# Patient Record
Sex: Male | Born: 1935 | Race: White | Hispanic: No | Marital: Married
Health system: Midwestern US, Academic
[De-identification: ages and names within clinical notes are randomized; demographics above are authoritative.]

---

## 2016-10-02 MED ORDER — HYDROCHLOROTHIAZIDE 25 MG PO TAB
12.5 mg | ORAL_TABLET | Freq: Every day | ORAL | 3 refills | 28.00000 days | Status: DC
Start: 2016-10-02 — End: 2017-09-03

## 2016-10-02 MED ORDER — LISINOPRIL 10 MG PO TAB
10 mg | ORAL_TABLET | Freq: Every day | ORAL | 3 refills | Status: DC
Start: 2016-10-02 — End: 2017-09-03

## 2016-10-02 MED ORDER — METOPROLOL TARTRATE 25 MG PO TAB
12.5 mg | ORAL_TABLET | Freq: Two times a day (BID) | ORAL | 3 refills | 90.00000 days | Status: DC
Start: 2016-10-02 — End: 2017-09-03

## 2016-10-02 MED ORDER — ATORVASTATIN 40 MG PO TAB
20 mg | ORAL_TABLET | Freq: Every day | ORAL | 3 refills | Status: DC
Start: 2016-10-02 — End: 2017-09-03

## 2016-12-11 ENCOUNTER — Encounter: Admit: 2016-12-11 | Discharge: 2016-12-11 | Payer: MEDICARE

## 2016-12-11 DIAGNOSIS — I2510 Atherosclerotic heart disease of native coronary artery without angina pectoris: Principal | ICD-10-CM

## 2017-01-21 ENCOUNTER — Encounter: Admit: 2017-01-21 | Discharge: 2017-01-21 | Payer: MEDICARE

## 2017-02-03 ENCOUNTER — Encounter: Admit: 2017-02-03 | Discharge: 2017-02-03 | Payer: MEDICARE

## 2017-02-03 ENCOUNTER — Ambulatory Visit: Admit: 2017-02-03 | Discharge: 2017-02-04 | Payer: MEDICARE

## 2017-02-03 DIAGNOSIS — E119 Type 2 diabetes mellitus without complications: ICD-10-CM

## 2017-02-03 DIAGNOSIS — I350 Nonrheumatic aortic (valve) stenosis: ICD-10-CM

## 2017-02-03 DIAGNOSIS — E785 Hyperlipidemia, unspecified: ICD-10-CM

## 2017-02-03 DIAGNOSIS — I2510 Atherosclerotic heart disease of native coronary artery without angina pectoris: Principal | ICD-10-CM

## 2017-02-03 DIAGNOSIS — I6529 Occlusion and stenosis of unspecified carotid artery: ICD-10-CM

## 2017-02-03 DIAGNOSIS — K929 Disease of digestive system, unspecified: ICD-10-CM

## 2017-02-03 DIAGNOSIS — Z87442 Personal history of urinary calculi: ICD-10-CM

## 2017-02-03 DIAGNOSIS — Z953 Presence of xenogenic heart valve: ICD-10-CM

## 2017-02-03 DIAGNOSIS — C4490 Unspecified malignant neoplasm of skin, unspecified: ICD-10-CM

## 2017-02-03 DIAGNOSIS — Z951 Presence of aortocoronary bypass graft: ICD-10-CM

## 2017-02-03 DIAGNOSIS — I10 Essential (primary) hypertension: ICD-10-CM

## 2017-02-03 DIAGNOSIS — H409 Unspecified glaucoma: ICD-10-CM

## 2017-02-03 DIAGNOSIS — T8203XS Leakage of heart valve prosthesis, sequela: ICD-10-CM

## 2017-02-03 DIAGNOSIS — C679 Malignant neoplasm of bladder, unspecified: ICD-10-CM

## 2017-02-03 DIAGNOSIS — E7800 Pure hypercholesterolemia, unspecified: ICD-10-CM

## 2017-02-04 DIAGNOSIS — I480 Paroxysmal atrial fibrillation: ICD-10-CM

## 2017-02-04 DIAGNOSIS — Z951 Presence of aortocoronary bypass graft: ICD-10-CM

## 2017-02-04 DIAGNOSIS — I444 Left anterior fascicular block: ICD-10-CM

## 2017-02-04 DIAGNOSIS — E784 Other hyperlipidemia: ICD-10-CM

## 2017-02-04 DIAGNOSIS — E119 Type 2 diabetes mellitus without complications: Secondary | ICD-10-CM

## 2017-02-04 DIAGNOSIS — I10 Essential (primary) hypertension: ICD-10-CM

## 2017-02-04 DIAGNOSIS — I350 Nonrheumatic aortic (valve) stenosis: ICD-10-CM

## 2017-02-04 DIAGNOSIS — Z952 Presence of prosthetic heart valve: Secondary | ICD-10-CM

## 2017-02-04 DIAGNOSIS — I2510 Atherosclerotic heart disease of native coronary artery without angina pectoris: Principal | ICD-10-CM

## 2017-02-04 DIAGNOSIS — Z8551 Personal history of malignant neoplasm of bladder: Secondary | ICD-10-CM

## 2017-08-21 ENCOUNTER — Encounter: Admit: 2017-08-21 | Discharge: 2017-08-21 | Payer: MEDICARE

## 2017-08-21 DIAGNOSIS — I10 Essential (primary) hypertension: ICD-10-CM

## 2017-08-21 DIAGNOSIS — E7800 Pure hypercholesterolemia, unspecified: ICD-10-CM

## 2017-08-21 DIAGNOSIS — C679 Malignant neoplasm of bladder, unspecified: ICD-10-CM

## 2017-08-21 DIAGNOSIS — I6529 Occlusion and stenosis of unspecified carotid artery: ICD-10-CM

## 2017-08-21 DIAGNOSIS — Z87442 Personal history of urinary calculi: ICD-10-CM

## 2017-08-21 DIAGNOSIS — C4490 Unspecified malignant neoplasm of skin, unspecified: ICD-10-CM

## 2017-08-21 DIAGNOSIS — Z951 Presence of aortocoronary bypass graft: ICD-10-CM

## 2017-08-21 DIAGNOSIS — H409 Unspecified glaucoma: ICD-10-CM

## 2017-08-21 DIAGNOSIS — I2510 Atherosclerotic heart disease of native coronary artery without angina pectoris: Principal | ICD-10-CM

## 2017-08-21 DIAGNOSIS — Z953 Presence of xenogenic heart valve: ICD-10-CM

## 2017-08-21 DIAGNOSIS — E785 Hyperlipidemia, unspecified: ICD-10-CM

## 2017-08-21 DIAGNOSIS — E119 Type 2 diabetes mellitus without complications: Secondary | ICD-10-CM

## 2017-08-21 DIAGNOSIS — K929 Disease of digestive system, unspecified: ICD-10-CM

## 2017-08-21 DIAGNOSIS — I350 Nonrheumatic aortic (valve) stenosis: ICD-10-CM

## 2017-08-28 ENCOUNTER — Encounter: Admit: 2017-08-28 | Discharge: 2017-08-28 | Payer: MEDICARE

## 2017-09-03 ENCOUNTER — Encounter: Admit: 2017-09-03 | Discharge: 2017-09-03 | Payer: MEDICARE

## 2017-09-03 ENCOUNTER — Ambulatory Visit: Admit: 2017-09-03 | Discharge: 2017-09-03 | Payer: MEDICARE

## 2017-09-03 DIAGNOSIS — E7800 Pure hypercholesterolemia, unspecified: ICD-10-CM

## 2017-09-03 DIAGNOSIS — I2510 Atherosclerotic heart disease of native coronary artery without angina pectoris: Secondary | ICD-10-CM

## 2017-09-03 DIAGNOSIS — Z01810 Encounter for preprocedural cardiovascular examination: ICD-10-CM

## 2017-09-03 DIAGNOSIS — E785 Hyperlipidemia, unspecified: ICD-10-CM

## 2017-09-03 DIAGNOSIS — C4490 Unspecified malignant neoplasm of skin, unspecified: ICD-10-CM

## 2017-09-03 DIAGNOSIS — K929 Disease of digestive system, unspecified: ICD-10-CM

## 2017-09-03 DIAGNOSIS — Z953 Presence of xenogenic heart valve: ICD-10-CM

## 2017-09-03 DIAGNOSIS — I350 Nonrheumatic aortic (valve) stenosis: ICD-10-CM

## 2017-09-03 DIAGNOSIS — Z951 Presence of aortocoronary bypass graft: ICD-10-CM

## 2017-09-03 DIAGNOSIS — H409 Unspecified glaucoma: ICD-10-CM

## 2017-09-03 DIAGNOSIS — I6529 Occlusion and stenosis of unspecified carotid artery: ICD-10-CM

## 2017-09-03 DIAGNOSIS — I25810 Atherosclerosis of coronary artery bypass graft(s) without angina pectoris: ICD-10-CM

## 2017-09-03 DIAGNOSIS — Z87442 Personal history of urinary calculi: ICD-10-CM

## 2017-09-03 DIAGNOSIS — Z952 Presence of prosthetic heart valve: ICD-10-CM

## 2017-09-03 DIAGNOSIS — I10 Essential (primary) hypertension: ICD-10-CM

## 2017-09-03 DIAGNOSIS — C679 Malignant neoplasm of bladder, unspecified: ICD-10-CM

## 2017-09-03 DIAGNOSIS — Z8551 Personal history of malignant neoplasm of bladder: ICD-10-CM

## 2017-09-03 DIAGNOSIS — E119 Type 2 diabetes mellitus without complications: Secondary | ICD-10-CM

## 2017-09-03 MED ORDER — METOPROLOL TARTRATE 25 MG PO TAB
12.5 mg | ORAL_TABLET | Freq: Two times a day (BID) | ORAL | 3 refills | 90.00000 days | Status: AC
Start: 2017-09-03 — End: 2017-11-03

## 2017-09-03 MED ORDER — ATORVASTATIN 40 MG PO TAB
20 mg | ORAL_TABLET | Freq: Every day | ORAL | 3 refills | Status: AC
Start: 2017-09-03 — End: 2017-11-03

## 2017-09-03 MED ORDER — LISINOPRIL 10 MG PO TAB
10 mg | ORAL_TABLET | Freq: Every day | ORAL | 3 refills | Status: AC
Start: 2017-09-03 — End: 2017-11-03

## 2017-09-03 MED ORDER — HYDROCHLOROTHIAZIDE 25 MG PO TAB
12.5 mg | ORAL_TABLET | Freq: Every day | ORAL | 3 refills | 28.00000 days | Status: AC
Start: 2017-09-03 — End: 2017-11-03

## 2017-11-03 ENCOUNTER — Encounter: Admit: 2017-11-03 | Discharge: 2017-11-03 | Payer: MEDICARE

## 2017-11-03 DIAGNOSIS — I350 Nonrheumatic aortic (valve) stenosis: Principal | ICD-10-CM

## 2017-11-03 MED ORDER — METOPROLOL TARTRATE 25 MG PO TAB
12.5 mg | ORAL_TABLET | Freq: Two times a day (BID) | ORAL | 3 refills | 90.00000 days | Status: AC
Start: 2017-11-03 — End: 2017-12-31

## 2017-11-03 MED ORDER — HYDROCHLOROTHIAZIDE 25 MG PO TAB
12.5 mg | ORAL_TABLET | Freq: Every day | ORAL | 3 refills | Status: SS
Start: 2017-11-03 — End: 2017-12-31

## 2017-11-03 MED ORDER — ATORVASTATIN 40 MG PO TAB
20 mg | ORAL_TABLET | Freq: Every day | ORAL | 3 refills | Status: AC
Start: 2017-11-03 — End: 2018-10-07

## 2017-11-03 MED ORDER — LISINOPRIL 10 MG PO TAB
10 mg | ORAL_TABLET | Freq: Every day | ORAL | 3 refills | Status: SS
Start: 2017-11-03 — End: 2017-12-31

## 2017-12-28 ENCOUNTER — Encounter: Admit: 2017-12-28 | Discharge: 2017-12-29 | Payer: MEDICARE

## 2017-12-29 ENCOUNTER — Encounter: Admit: 2017-12-29 | Discharge: 2017-12-29 | Payer: MEDICARE

## 2017-12-29 DIAGNOSIS — I480 Paroxysmal atrial fibrillation: Principal | ICD-10-CM

## 2017-12-29 MED ORDER — POTASSIUM CHLORIDE IN WATER 10 MEQ/50 ML IV PGBK
10 meq | INTRAVENOUS | 0 refills | Status: CP
Start: 2017-12-29 — End: ?
  Administered 2017-12-30 (×4): 10 meq via INTRAVENOUS

## 2017-12-29 MED ORDER — POTASSIUM CHLORIDE 20 MEQ PO TBTQ
40 meq | Freq: Once | ORAL | 0 refills | Status: CP
Start: 2017-12-29 — End: ?
  Administered 2017-12-30: 05:00:00 40 meq via ORAL

## 2017-12-29 MED ORDER — LISINOPRIL 10 MG PO TAB
10 mg | Freq: Every day | ORAL | 0 refills | Status: DC
Start: 2017-12-29 — End: 2017-12-29

## 2017-12-29 MED ORDER — TAMSULOSIN 0.4 MG PO CAP
.4 mg | Freq: Every evening | ORAL | 0 refills | Status: DC
Start: 2017-12-29 — End: 2017-12-31
  Administered 2017-12-30 – 2017-12-31 (×2): 0.4 mg via ORAL

## 2017-12-29 MED ORDER — METOPROLOL TARTRATE 25 MG PO TAB
25 mg | Freq: Two times a day (BID) | ORAL | 0 refills | Status: DC
Start: 2017-12-29 — End: 2017-12-30
  Administered 2017-12-30 (×2): 25 mg via ORAL

## 2017-12-29 MED ORDER — VANCOMYCIN 1G/250ML D5W IVPB (VIAL2BAG)
1 g | INTRAVENOUS | 0 refills | Status: DC
Start: 2017-12-29 — End: 2017-12-31
  Administered 2017-12-30 – 2017-12-31 (×4): 1000 mg via INTRAVENOUS

## 2017-12-29 MED ORDER — PIPERACILLIN/TAZOBACTAM 3.375 G/NS IVPB (MB+)
3.375 g | INTRAVENOUS | 0 refills | Status: DC
Start: 2017-12-29 — End: 2017-12-30
  Administered 2017-12-30 (×2): 3.375 g via INTRAVENOUS

## 2017-12-29 MED ORDER — LATANOPROST 0.005 % OP DROP
1 [drp] | Freq: Every evening | OPHTHALMIC | 0 refills | Status: DC
Start: 2017-12-29 — End: 2017-12-31
  Administered 2017-12-30: 01:00:00 1 [drp] via OPHTHALMIC

## 2017-12-29 MED ORDER — ASPIRIN 81 MG PO CHEW
81 mg | Freq: Every day | ORAL | 0 refills | Status: DC
Start: 2017-12-29 — End: 2017-12-31
  Administered 2017-12-29 – 2017-12-31 (×3): 81 mg via ORAL

## 2017-12-29 MED ORDER — ENOXAPARIN 80 MG/0.8 ML SC SYRG
80 mg | Freq: Two times a day (BID) | SUBCUTANEOUS | 0 refills | Status: DC
Start: 2017-12-29 — End: 2017-12-31
  Administered 2017-12-30 – 2017-12-31 (×4): 80 mg via SUBCUTANEOUS

## 2017-12-29 MED ORDER — ATORVASTATIN 20 MG PO TAB
10 mg | Freq: Every day | ORAL | 0 refills | Status: DC
Start: 2017-12-29 — End: 2017-12-31
  Administered 2017-12-29 – 2017-12-31 (×3): 10 mg via ORAL

## 2017-12-29 MED ORDER — DILTIAZEM 125MG/125ML IV DRIP
5-15 mg/h | INTRAVENOUS | 0 refills | Status: DC
Start: 2017-12-29 — End: 2017-12-30
  Administered 2017-12-29 (×2): 5 mg/h via INTRAVENOUS

## 2017-12-29 MED ORDER — VANCOMYCIN PHARMACY TO MANAGE
1 | 0 refills | Status: DC
Start: 2017-12-29 — End: 2017-12-31

## 2017-12-29 MED ORDER — INSULIN ASPART 100 UNIT/ML SC FLEXPEN
0-6 [IU] | Freq: Before meals | SUBCUTANEOUS | 0 refills | Status: DC
Start: 2017-12-29 — End: 2017-12-31
  Administered 2017-12-30: 03:00:00 1 [IU] via SUBCUTANEOUS

## 2017-12-29 MED ORDER — MULTIVITAMIN PO TAB
1 | Freq: Every day | ORAL | 0 refills | Status: DC
Start: 2017-12-29 — End: 2017-12-31
  Administered 2017-12-30 – 2017-12-31 (×3): 1 via ORAL

## 2017-12-29 MED ORDER — CALCIUM CARBONATE-VITAMIN D3 500 MG(1,250MG) -200 UNIT PO TAB
1 | Freq: Every day | ORAL | 0 refills | Status: DC
Start: 2017-12-29 — End: 2017-12-31
  Administered 2017-12-29 – 2017-12-31 (×3): 1 via ORAL

## 2017-12-29 MED ORDER — FINASTERIDE 5 MG PO TAB
5 mg | Freq: Every evening | ORAL | 0 refills | Status: DC
Start: 2017-12-29 — End: 2017-12-31
  Administered 2017-12-30 – 2017-12-31 (×2): 5 mg via ORAL

## 2017-12-29 MED ORDER — PIPERACILLIN/TAZOBACTAM 3.375 G/NS IVPB (MB+)
3.375 g | INTRAVENOUS | 0 refills | Status: DC
Start: 2017-12-29 — End: 2017-12-30
  Administered 2017-12-30 (×4): 3.375 g via INTRAVENOUS

## 2017-12-30 LAB — CBC AND DIFF
Lab: 6.2 10*3/uL (ref 4.5–11.0)
Lab: 6.6 K/UL — ABNORMAL LOW (ref 4.5–11.0)

## 2017-12-30 LAB — URINALYSIS DIPSTICK
Lab: NEGATIVE 10*3/uL (ref 0–0.20)
Lab: NEGATIVE mL/min (ref 0–0.80)

## 2017-12-30 LAB — PHOSPHORUS: Lab: 2.3 mg/dL (ref 2.0–4.5)

## 2017-12-30 LAB — COMPREHENSIVE METABOLIC PANEL
Lab: 107 MMOL/L — ABNORMAL LOW (ref 98–110)
Lab: 137 MMOL/L — ABNORMAL LOW (ref 137–147)
Lab: 253 mg/dL — ABNORMAL HIGH (ref 70–100)
Lab: 141 MMOL/L — ABNORMAL LOW (ref 137–147)

## 2017-12-30 LAB — LIPID PROFILE
Lab: 128 mg/dL
Lab: 153 mg/dL (ref <200)
Lab: 31 mg/dL

## 2017-12-30 LAB — TROPONIN-I
Lab: 0.2 ng/mL — ABNORMAL HIGH (ref 0.0–0.05)
Lab: 0.2 ng/mL — ABNORMAL HIGH (ref 0.0–0.05)
Lab: 0.1 ng/mL — ABNORMAL HIGH (ref 0.0–0.05)

## 2017-12-30 LAB — BNP (B-TYPE NATRIURETIC PEPTI): Lab: 546 pg/mL — ABNORMAL HIGH (ref 0–100)

## 2017-12-30 LAB — MAGNESIUM: Lab: 1.6 mg/dL (ref 1.6–2.6)

## 2017-12-30 LAB — POC GLUCOSE
Lab: 235 mg/dL — ABNORMAL HIGH (ref 70–100)
Lab: 155 mg/dL — ABNORMAL HIGH (ref 70–100)
Lab: 165 mg/dL — ABNORMAL HIGH (ref 70–100)
Lab: 233 mg/dL — ABNORMAL HIGH (ref 70–100)

## 2017-12-30 LAB — URINALYSIS, MICROSCOPIC

## 2017-12-30 LAB — HEMOGLOBIN A1C: Lab: 9.5 % — ABNORMAL HIGH (ref <150)

## 2017-12-30 LAB — TSH WITH FREE T4 REFLEX: Lab: 3.8 uU/mL (ref 0.35–5.00)

## 2017-12-30 LAB — PTT (APTT): Lab: 23 s — ABNORMAL LOW (ref <100)

## 2017-12-30 LAB — VANCOMYCIN TROUGH: Lab: 7.9 ug/mL — ABNORMAL LOW (ref 10.0–20.0)

## 2017-12-30 LAB — PROTIME INR (PT): Lab: 1.1 mg/dL — ABNORMAL LOW (ref >40)

## 2017-12-30 MED ORDER — METOPROLOL TARTRATE 25 MG PO TAB
25 mg | Freq: Once | ORAL | 0 refills | Status: CP
Start: 2017-12-30 — End: ?
  Administered 2017-12-30: 17:00:00 25 mg via ORAL

## 2017-12-30 MED ORDER — METOPROLOL TARTRATE 25 MG PO TAB
37.5 mg | Freq: Two times a day (BID) | ORAL | 0 refills | Status: DC
Start: 2017-12-30 — End: 2017-12-31
  Administered 2017-12-31 (×2): 37.5 mg via ORAL

## 2017-12-30 MED ORDER — PIPERACILLIN/TAZOBACTAM 3.375 G/NS IVPB (MB+)
3.375 g | INTRAVENOUS | 0 refills | Status: DC
Start: 2017-12-30 — End: 2017-12-31
  Administered 2017-12-30 – 2017-12-31 (×8): 3.375 g via INTRAVENOUS

## 2017-12-30 MED ORDER — PERFLUTREN LIPID MICROSPHERES 1.1 MG/ML IV SUSP
1-20 mL | Freq: Once | INTRAVENOUS | 0 refills | Status: CP | PRN
Start: 2017-12-30 — End: ?
  Administered 2017-12-30: 17:00:00 2.5 mL via INTRAVENOUS

## 2017-12-30 MED ADMIN — SODIUM CHLORIDE 0.9 % IV SOLP [27838]: 1000 mL | INTRAVENOUS | @ 05:00:00 | Stop: 2017-12-30 | NDC 00338004904

## 2017-12-31 ENCOUNTER — Inpatient Hospital Stay
Admit: 2017-12-29 | Discharge: 2017-12-31 | Disposition: A | Discharge status: Home / Self Care | Payer: MEDICARE | Source: Other Acute Inpatient Hospital

## 2017-12-31 ENCOUNTER — Inpatient Hospital Stay: Admit: 2017-12-30 | Discharge: 2017-12-30 | Payer: MEDICARE

## 2017-12-31 ENCOUNTER — Inpatient Hospital Stay: Admit: 2017-12-29 | Discharge: 2017-12-29 | Payer: MEDICARE

## 2017-12-31 DIAGNOSIS — R55 Syncope and collapse: ICD-10-CM

## 2017-12-31 DIAGNOSIS — I452 Bifascicular block: ICD-10-CM

## 2017-12-31 DIAGNOSIS — Z8551 Personal history of malignant neoplasm of bladder: ICD-10-CM

## 2017-12-31 DIAGNOSIS — N390 Urinary tract infection, site not specified: ICD-10-CM

## 2017-12-31 DIAGNOSIS — Z953 Presence of xenogenic heart valve: ICD-10-CM

## 2017-12-31 DIAGNOSIS — I824Z2 Acute embolism and thrombosis of unspecified deep veins of left distal lower extremity: ICD-10-CM

## 2017-12-31 DIAGNOSIS — I129 Hypertensive chronic kidney disease with stage 1 through stage 4 chronic kidney disease, or unspecified chronic kidney disease: ICD-10-CM

## 2017-12-31 DIAGNOSIS — E876 Hypokalemia: ICD-10-CM

## 2017-12-31 DIAGNOSIS — Z951 Presence of aortocoronary bypass graft: ICD-10-CM

## 2017-12-31 DIAGNOSIS — E1122 Type 2 diabetes mellitus with diabetic chronic kidney disease: ICD-10-CM

## 2017-12-31 DIAGNOSIS — E785 Hyperlipidemia, unspecified: ICD-10-CM

## 2017-12-31 DIAGNOSIS — Z85828 Personal history of other malignant neoplasm of skin: ICD-10-CM

## 2017-12-31 DIAGNOSIS — I480 Paroxysmal atrial fibrillation: Principal | ICD-10-CM

## 2017-12-31 DIAGNOSIS — I2510 Atherosclerotic heart disease of native coronary artery without angina pectoris: ICD-10-CM

## 2017-12-31 DIAGNOSIS — K219 Gastro-esophageal reflux disease without esophagitis: ICD-10-CM

## 2017-12-31 DIAGNOSIS — N183 Chronic kidney disease, stage 3 (moderate): ICD-10-CM

## 2017-12-31 DIAGNOSIS — Z794 Long term (current) use of insulin: ICD-10-CM

## 2017-12-31 DIAGNOSIS — Z66 Do not resuscitate: ICD-10-CM

## 2017-12-31 DIAGNOSIS — N400 Enlarged prostate without lower urinary tract symptoms: ICD-10-CM

## 2017-12-31 LAB — POC GLUCOSE
Lab: 195 mg/dL — ABNORMAL HIGH (ref 70–100)
Lab: 161 mg/dL — ABNORMAL HIGH (ref 70–100)
Lab: 184 mg/dL — ABNORMAL HIGH (ref 70–100)

## 2017-12-31 LAB — CULTURE-URINE W/SENSITIVITY: Lab: <10 U/L — AB (ref 7–40)

## 2017-12-31 LAB — COMPREHENSIVE METABOLIC PANEL: Lab: 142 MMOL/L — ABNORMAL LOW (ref >60)

## 2017-12-31 LAB — CBC AND DIFF: Lab: 6.5 K/UL — ABNORMAL HIGH (ref 4.5–11.0)

## 2017-12-31 MED ORDER — APIXABAN 5 MG PO TAB
10 mg | Freq: Two times a day (BID) | ORAL | 0 refills | Status: DC
Start: 2017-12-31 — End: 2017-12-31
  Administered 2017-12-31: 21:00:00 10 mg via ORAL

## 2017-12-31 MED ORDER — METOPROLOL TARTRATE 37.5 MG PO TAB
37.5 mg | ORAL_TABLET | Freq: Two times a day (BID) | ORAL | 1 refills | 90.00000 days | Status: AC
Start: 2017-12-31 — End: 2018-09-17

## 2017-12-31 MED ORDER — APIXABAN 5 MG PO TAB
5 mg | ORAL_TABLET | Freq: Two times a day (BID) | ORAL | 1 refills | Status: AC
Start: 2017-12-31 — End: 2018-02-05

## 2017-12-31 MED ORDER — HYDROCHLOROTHIAZIDE 25 MG PO TAB
12.5 mg | ORAL_TABLET | Freq: Every day | ORAL | 3 refills | 28.00000 days | Status: AC
Start: 2017-12-31 — End: 2018-09-17

## 2017-12-31 MED ORDER — ASCORBIC ACID (VITAMIN C) 500 MG PO TAB
250 mg | Freq: Every day | ORAL | 0 refills | Status: DC
Start: 2017-12-31 — End: 2017-12-31
  Administered 2017-12-31: 20:00:00 250 mg via ORAL

## 2017-12-31 MED ORDER — METOPROLOL TARTRATE 25 MG PO TAB
37.5 mg | ORAL_TABLET | Freq: Two times a day (BID) | ORAL | 1 refills | Status: CN
Start: 2017-12-31 — End: ?

## 2017-12-31 MED ORDER — ZINC SULFATE 220 (50) MG PO CAP
220 mg | Freq: Every day | ORAL | 0 refills | Status: DC
Start: 2017-12-31 — End: 2017-12-31
  Administered 2017-12-31: 20:00:00 220 mg via ORAL

## 2017-12-31 MED ORDER — APIXABAN 5 MG (74 TABS) PO DSPK
ORAL_TABLET | Freq: Two times a day (BID) | 0 refills | Status: AC
Start: 2017-12-31 — End: 2018-02-05

## 2017-12-31 MED ORDER — LISINOPRIL 10 MG PO TAB
10 mg | ORAL_TABLET | Freq: Every day | ORAL | 3 refills | Status: AC
Start: 2017-12-31 — End: 2018-10-07

## 2018-01-15 ENCOUNTER — Encounter: Admit: 2018-01-15 | Discharge: 2018-01-15 | Payer: MEDICARE

## 2018-02-05 ENCOUNTER — Encounter: Admit: 2018-02-05 | Discharge: 2018-02-05 | Payer: MEDICARE

## 2018-02-05 ENCOUNTER — Ambulatory Visit: Admit: 2018-02-05 | Discharge: 2018-02-05 | Payer: MEDICARE

## 2018-02-05 DIAGNOSIS — I5032 Chronic diastolic (congestive) heart failure: ICD-10-CM

## 2018-02-05 DIAGNOSIS — E7800 Pure hypercholesterolemia, unspecified: ICD-10-CM

## 2018-02-05 DIAGNOSIS — H409 Unspecified glaucoma: ICD-10-CM

## 2018-02-05 DIAGNOSIS — I350 Nonrheumatic aortic (valve) stenosis: ICD-10-CM

## 2018-02-05 DIAGNOSIS — I480 Paroxysmal atrial fibrillation: ICD-10-CM

## 2018-02-05 DIAGNOSIS — I2510 Atherosclerotic heart disease of native coronary artery without angina pectoris: Secondary | ICD-10-CM

## 2018-02-05 DIAGNOSIS — I10 Essential (primary) hypertension: ICD-10-CM

## 2018-02-05 DIAGNOSIS — I6523 Occlusion and stenosis of bilateral carotid arteries: ICD-10-CM

## 2018-02-05 DIAGNOSIS — E119 Type 2 diabetes mellitus without complications: ICD-10-CM

## 2018-02-05 DIAGNOSIS — I471 Supraventricular tachycardia: ICD-10-CM

## 2018-02-05 DIAGNOSIS — Z953 Presence of xenogenic heart valve: ICD-10-CM

## 2018-02-05 DIAGNOSIS — Z951 Presence of aortocoronary bypass graft: ICD-10-CM

## 2018-02-05 DIAGNOSIS — I6529 Occlusion and stenosis of unspecified carotid artery: ICD-10-CM

## 2018-02-05 DIAGNOSIS — T8203XA Leakage of heart valve prosthesis, initial encounter: ICD-10-CM

## 2018-02-05 DIAGNOSIS — E782 Mixed hyperlipidemia: ICD-10-CM

## 2018-02-05 DIAGNOSIS — K929 Disease of digestive system, unspecified: ICD-10-CM

## 2018-02-05 DIAGNOSIS — I4891 Unspecified atrial fibrillation: ICD-10-CM

## 2018-02-05 DIAGNOSIS — I82592 Chronic embolism and thrombosis of other specified deep vein of left lower extremity: Principal | ICD-10-CM

## 2018-02-05 DIAGNOSIS — Z952 Presence of prosthetic heart valve: ICD-10-CM

## 2018-02-05 DIAGNOSIS — Z01810 Encounter for preprocedural cardiovascular examination: Secondary | ICD-10-CM

## 2018-02-05 DIAGNOSIS — Z8551 Personal history of malignant neoplasm of bladder: ICD-10-CM

## 2018-02-05 DIAGNOSIS — N183 Chronic kidney disease, stage 3 (moderate): ICD-10-CM

## 2018-02-05 DIAGNOSIS — N390 Urinary tract infection, site not specified: ICD-10-CM

## 2018-02-05 DIAGNOSIS — E785 Hyperlipidemia, unspecified: ICD-10-CM

## 2018-02-05 DIAGNOSIS — C679 Malignant neoplasm of bladder, unspecified: ICD-10-CM

## 2018-02-05 DIAGNOSIS — C4490 Unspecified malignant neoplasm of skin, unspecified: ICD-10-CM

## 2018-02-05 DIAGNOSIS — Z87442 Personal history of urinary calculi: ICD-10-CM

## 2018-09-17 ENCOUNTER — Encounter: Admit: 2018-09-17 | Discharge: 2018-09-17 | Payer: MEDICARE

## 2018-09-17 DIAGNOSIS — I350 Nonrheumatic aortic (valve) stenosis: Principal | ICD-10-CM

## 2018-09-17 MED ORDER — METOPROLOL TARTRATE 25 MG PO TAB
ORAL_TABLET | Freq: Two times a day (BID) | 0 refills
Start: 2018-09-17 — End: ?

## 2018-09-17 MED ORDER — METOPROLOL TARTRATE 37.5 MG PO TAB
37.5 mg | ORAL_TABLET | Freq: Every day | ORAL | 1 refills | 90.00000 days | Status: DC
Start: 2018-09-17 — End: 2018-10-07

## 2018-09-17 MED ORDER — HYDROCHLOROTHIAZIDE 25 MG PO TAB
ORAL_TABLET | Freq: Every day | ORAL | 1 refills | 28.00000 days | Status: DC
Start: 2018-09-17 — End: 2018-10-07

## 2018-09-17 NOTE — Telephone Encounter
I spoke with John Combs, who tells me that John Combs continues to take 12.5mg  of hctz daily.  He takes metoprolol 37.5mg  just once daily.  Refilled both per John Combs's request.

## 2018-10-07 ENCOUNTER — Encounter: Admit: 2018-10-07 | Discharge: 2018-10-07 | Payer: MEDICARE

## 2018-10-07 MED ORDER — ATORVASTATIN 20 MG PO TAB
20 mg | ORAL_TABLET | Freq: Every day | ORAL | 3 refills | Status: DC
Start: 2018-10-07 — End: 2019-05-04

## 2018-10-07 MED ORDER — HYDROCHLOROTHIAZIDE 12.5 MG PO TAB
12.5 mg | ORAL_TABLET | Freq: Every morning | ORAL | 3 refills | 30.00000 days | Status: DC
Start: 2018-10-07 — End: 2019-05-04

## 2018-10-07 MED ORDER — METOPROLOL TARTRATE 37.5 MG PO TAB
37.5 mg | ORAL_TABLET | Freq: Every day | ORAL | 3 refills | 90.00000 days | Status: DC
Start: 2018-10-07 — End: 2019-05-04

## 2018-10-20 ENCOUNTER — Encounter: Admit: 2018-10-20 | Discharge: 2018-10-20

## 2018-10-20 NOTE — Telephone Encounter
Rec'd call from [facility] with [facility] (ph: [phone] fax: [phone]).  On 6/30 Mr. John Combs is having a bladder biopsy with fulguration and they are needing cardiac risk stratification.  Requesting a note to be faxed to their office.

## 2018-10-20 NOTE — Telephone Encounter
I spoke with John Combs at [facility] and let her know that we would provide cardiac risk stratification once patient has been seen in the office on 6/24.

## 2018-10-20 NOTE — Telephone Encounter
Jaclynn Major, MD  Demon Volante, [place], RN   Caller: Unspecified (Today, 9:38 AM)            Doesn't sound like any contraindications, but will comment after I see him.    Previous Messages      ----- Message -----   From: Nichola Sizer, RN   Sent: 10/20/2018  9:46 AM CDT   To: Jaclynn Major, MD     ----- Message from Nichola Sizer, RN sent at 10/20/2018 9:46 AM CDT -----   Dr. Frutoso Chase, Mr. Mcmillon is having a 9 month follow up with you this month on 6/24. Can you go ahead and provide risk stratification for his 6/30 surgery or would you prefer to wait until you see him? Also, please let me know your recommendations on when he should stop his baby aspirin for this procedure. Thanks!

## 2018-11-03 ENCOUNTER — Encounter: Admit: 2018-11-03 | Discharge: 2018-11-03

## 2018-11-11 ENCOUNTER — Ambulatory Visit: Admit: 2018-11-11 | Discharge: 2018-11-12

## 2018-11-11 ENCOUNTER — Encounter: Admit: 2018-11-11 | Discharge: 2018-11-11

## 2018-11-11 DIAGNOSIS — I10 Essential (primary) hypertension: Secondary | ICD-10-CM

## 2018-11-11 DIAGNOSIS — I2510 Atherosclerotic heart disease of native coronary artery without angina pectoris: Secondary | ICD-10-CM

## 2018-11-11 DIAGNOSIS — Z952 Presence of prosthetic heart valve: Secondary | ICD-10-CM

## 2018-11-11 DIAGNOSIS — Z953 Presence of xenogenic heart valve: Secondary | ICD-10-CM

## 2018-11-11 DIAGNOSIS — Z87442 Personal history of urinary calculi: Secondary | ICD-10-CM

## 2018-11-11 DIAGNOSIS — T8203XA Leakage of heart valve prosthesis, initial encounter: Secondary | ICD-10-CM

## 2018-11-11 DIAGNOSIS — I4891 Unspecified atrial fibrillation: Secondary | ICD-10-CM

## 2018-11-11 DIAGNOSIS — Z951 Presence of aortocoronary bypass graft: Secondary | ICD-10-CM

## 2018-11-11 DIAGNOSIS — I350 Nonrheumatic aortic (valve) stenosis: Secondary | ICD-10-CM

## 2018-11-11 DIAGNOSIS — I480 Paroxysmal atrial fibrillation: Secondary | ICD-10-CM

## 2018-11-11 DIAGNOSIS — I25810 Atherosclerosis of coronary artery bypass graft(s) without angina pectoris: Secondary | ICD-10-CM

## 2018-11-11 DIAGNOSIS — N183 Chronic kidney disease, stage 3 (moderate): Secondary | ICD-10-CM

## 2018-11-11 DIAGNOSIS — I6529 Occlusion and stenosis of unspecified carotid artery: Secondary | ICD-10-CM

## 2018-11-11 DIAGNOSIS — Z8551 Personal history of malignant neoplasm of bladder: Secondary | ICD-10-CM

## 2018-11-11 DIAGNOSIS — I6523 Occlusion and stenosis of bilateral carotid arteries: Secondary | ICD-10-CM

## 2018-11-11 DIAGNOSIS — Z01810 Encounter for preprocedural cardiovascular examination: Secondary | ICD-10-CM

## 2018-11-11 DIAGNOSIS — E7800 Pure hypercholesterolemia, unspecified: Secondary | ICD-10-CM

## 2018-11-11 DIAGNOSIS — E119 Type 2 diabetes mellitus without complications: Secondary | ICD-10-CM

## 2018-11-11 DIAGNOSIS — E785 Hyperlipidemia, unspecified: Secondary | ICD-10-CM

## 2018-11-11 DIAGNOSIS — H409 Unspecified glaucoma: Secondary | ICD-10-CM

## 2018-11-11 DIAGNOSIS — E782 Mixed hyperlipidemia: Secondary | ICD-10-CM

## 2018-11-11 DIAGNOSIS — K929 Disease of digestive system, unspecified: Secondary | ICD-10-CM

## 2018-11-11 DIAGNOSIS — C679 Malignant neoplasm of bladder, unspecified: Secondary | ICD-10-CM

## 2018-11-11 DIAGNOSIS — C4490 Unspecified malignant neoplasm of skin, unspecified: Secondary | ICD-10-CM

## 2018-11-11 NOTE — Telephone Encounter
Patient has surgery with [facility] next week.  Faxed office visit note/clearance to [phone]

## 2018-11-11 NOTE — Patient Instructions
Before you leave, please schedule an October follow up with Dr. Frutoso Chase, with an echocardiogram.    John Combs will send a cardiac clearance note to your urologist.    If you have any questions in the meantime, please call John Combs, John Combs or send a MyChart message.  Thank you!

## 2018-11-12 DIAGNOSIS — E7849 Other hyperlipidemia: Secondary | ICD-10-CM

## 2018-11-12 DIAGNOSIS — N189 Chronic kidney disease, unspecified: Secondary | ICD-10-CM

## 2018-11-12 DIAGNOSIS — I10 Essential (primary) hypertension: Secondary | ICD-10-CM

## 2018-11-12 DIAGNOSIS — I5032 Chronic diastolic (congestive) heart failure: Secondary | ICD-10-CM

## 2018-11-12 DIAGNOSIS — I82592 Chronic embolism and thrombosis of other specified deep vein of left lower extremity: Secondary | ICD-10-CM

## 2018-11-12 DIAGNOSIS — Z8679 Personal history of other diseases of the circulatory system: Secondary | ICD-10-CM

## 2018-11-12 DIAGNOSIS — E119 Type 2 diabetes mellitus without complications: Secondary | ICD-10-CM

## 2018-11-12 DIAGNOSIS — I471 Supraventricular tachycardia: Secondary | ICD-10-CM

## 2018-11-19 ENCOUNTER — Encounter: Admit: 2018-11-19 | Discharge: 2018-11-19

## 2018-11-19 NOTE — Telephone Encounter
Rec'd call from [place] at [facility] pre-surgery clinic.  Patient has an upcoming surgery with Dr. Rickey Barbara - they need cardiac clearance from Dr. Frutoso Chase to be faxed to [phone].  Their phone number if needed is [phone].  Will fax most recent office visit note, which includes cardiac clearance.

## 2019-02-11 ENCOUNTER — Encounter: Admit: 2019-02-11 | Discharge: 2019-02-11 | Payer: MEDICARE

## 2019-02-16 LAB — COMPREHENSIVE METABOLIC PANEL
Lab: 1
Lab: 1.1
Lab: 105
Lab: 144
Lab: 167 — ABNORMAL HIGH
Lab: 18
Lab: 25
Lab: 3.8
Lab: 31
Lab: 4.3
Lab: 62
Lab: 7
Lab: 7.7
Lab: 9.5

## 2019-02-16 LAB — LIPID PROFILE: Lab: 150

## 2019-02-17 ENCOUNTER — Encounter: Admit: 2019-02-17 | Discharge: 2019-02-17 | Payer: MEDICARE

## 2019-02-17 DIAGNOSIS — E782 Mixed hyperlipidemia: Secondary | ICD-10-CM

## 2019-02-17 DIAGNOSIS — I10 Essential (primary) hypertension: Secondary | ICD-10-CM

## 2019-02-17 DIAGNOSIS — E119 Type 2 diabetes mellitus without complications: Secondary | ICD-10-CM

## 2019-02-22 NOTE — Patient Instructions
Please call our schedulers in about 30 days ([phone]) to set up a 6 month appointment with Dr. Ferrell.    If you have any questions in the meantime, please call Lafreda Casebeer, RN [phone] or send a MyChart message.  Thank you!

## 2019-05-04 ENCOUNTER — Encounter: Admit: 2019-05-04 | Discharge: 2019-05-04 | Payer: MEDICARE

## 2019-05-04 MED ORDER — HYDROCHLOROTHIAZIDE 12.5 MG PO TAB
12.5 mg | ORAL_TABLET | Freq: Every morning | ORAL | 3 refills | 30.00000 days | Status: AC
Start: 2019-05-04 — End: ?

## 2019-05-04 MED ORDER — METOPROLOL TARTRATE 37.5 MG PO TAB
37.5 mg | ORAL_TABLET | Freq: Every day | ORAL | 3 refills | 90.00000 days | Status: AC
Start: 2019-05-04 — End: ?

## 2019-05-04 MED ORDER — ATORVASTATIN 20 MG PO TAB
20 mg | ORAL_TABLET | Freq: Every day | ORAL | 3 refills | Status: AC
Start: 2019-05-04 — End: ?

## 2019-05-04 NOTE — Telephone Encounter
Patient LVM asking if he could get his prescriptions forwarded to [facility] in [place].  Forwarded per patient request.  Patient notified.

## 2019-09-01 ENCOUNTER — Encounter: Admit: 2019-09-01 | Discharge: 2019-09-01 | Payer: MEDICARE

## 2019-11-11 ENCOUNTER — Encounter: Admit: 2019-11-11 | Discharge: 2019-11-11 | Payer: MEDICARE

## 2020-01-05 ENCOUNTER — Encounter: Admit: 2020-01-05 | Discharge: 2020-01-05 | Payer: MEDICARE

## 2020-01-05 NOTE — Progress Notes
Please send the following records for continuity of care.     Most recent lipid and liver profile, chemistry panel, and thyroid panel.     Please fax to:  Attention    Fax: [phone]    Thank you,     [facility]  Cardiovascular Medicine  [address] [place] [address]  Phone: [phone]  Fax: [phone]

## 2020-01-13 ENCOUNTER — Encounter: Admit: 2020-01-13 | Discharge: 2020-01-13 | Payer: MEDICARE

## 2020-01-17 ENCOUNTER — Ambulatory Visit: Admit: 2020-01-17 | Discharge: 2020-01-17 | Payer: MEDICARE

## 2020-01-17 ENCOUNTER — Encounter: Admit: 2020-01-17 | Discharge: 2020-01-17 | Payer: MEDICARE

## 2020-01-17 DIAGNOSIS — Z8551 Personal history of malignant neoplasm of bladder: Secondary | ICD-10-CM

## 2020-01-17 DIAGNOSIS — I10 Essential (primary) hypertension: Secondary | ICD-10-CM

## 2020-01-17 DIAGNOSIS — I350 Nonrheumatic aortic (valve) stenosis: Secondary | ICD-10-CM

## 2020-01-17 DIAGNOSIS — C679 Malignant neoplasm of bladder, unspecified: Secondary | ICD-10-CM

## 2020-01-17 DIAGNOSIS — E119 Type 2 diabetes mellitus without complications: Secondary | ICD-10-CM

## 2020-01-17 DIAGNOSIS — C4490 Unspecified malignant neoplasm of skin, unspecified: Secondary | ICD-10-CM

## 2020-01-17 DIAGNOSIS — E7800 Pure hypercholesterolemia, unspecified: Secondary | ICD-10-CM

## 2020-01-17 DIAGNOSIS — I480 Paroxysmal atrial fibrillation: Secondary | ICD-10-CM

## 2020-01-17 DIAGNOSIS — Z951 Presence of aortocoronary bypass graft: Secondary | ICD-10-CM

## 2020-01-17 DIAGNOSIS — N183 Stage 3 chronic kidney disease, unspecified whether stage 3a or 3b CKD (HCC): Secondary | ICD-10-CM

## 2020-01-17 DIAGNOSIS — K929 Disease of digestive system, unspecified: Secondary | ICD-10-CM

## 2020-01-17 DIAGNOSIS — H409 Unspecified glaucoma: Secondary | ICD-10-CM

## 2020-01-17 DIAGNOSIS — I2510 Atherosclerotic heart disease of native coronary artery without angina pectoris: Secondary | ICD-10-CM

## 2020-01-17 DIAGNOSIS — I6529 Occlusion and stenosis of unspecified carotid artery: Secondary | ICD-10-CM

## 2020-01-17 DIAGNOSIS — E782 Mixed hyperlipidemia: Secondary | ICD-10-CM

## 2020-01-17 DIAGNOSIS — E785 Hyperlipidemia, unspecified: Secondary | ICD-10-CM

## 2020-01-17 DIAGNOSIS — Z87442 Personal history of urinary calculi: Secondary | ICD-10-CM

## 2020-01-17 DIAGNOSIS — I471 Supraventricular tachycardia: Secondary | ICD-10-CM

## 2020-01-17 DIAGNOSIS — Z953 Presence of xenogenic heart valve: Secondary | ICD-10-CM

## 2020-01-17 DIAGNOSIS — Z952 Presence of prosthetic heart valve: Secondary | ICD-10-CM

## 2020-01-17 NOTE — Patient Instructions
John Combs,    We will schedule an echo before you leave today to be done on a day that works for you.  [name] will call you with results as soon as they are available.  This may take up to 10 business days from the time of your procedure.    Please reduce metoprolol to 1/2 tablet (18.75mg ) daily.    Please call us back around November to schedule an April follow up with Dr. Jacqulyn Ducking.  The number to call is [phone].    If you have any questions in the meantime, please call Varney Baas [phone] or send a MyChart message.  Thank you!

## 2020-02-23 ENCOUNTER — Encounter: Admit: 2020-02-23 | Discharge: 2020-02-23 | Payer: MEDICARE

## 2020-02-23 ENCOUNTER — Ambulatory Visit: Admit: 2020-02-23 | Discharge: 2020-02-23 | Payer: MEDICARE

## 2020-02-23 DIAGNOSIS — I350 Nonrheumatic aortic (valve) stenosis: Secondary | ICD-10-CM

## 2020-03-02 ENCOUNTER — Encounter: Admit: 2020-03-02 | Discharge: 2020-03-02 | Payer: MEDICARE

## 2020-03-02 NOTE — Telephone Encounter
I spoke with John Combs and let him know that his preliminary echo results look unchanged.  I told him that I would call him back if Dr. Jacqulyn Ducking has any concerns.  Patient confirmed understanding.

## 2020-03-03 ENCOUNTER — Encounter: Admit: 2020-03-03 | Discharge: 2020-03-03 | Payer: MEDICARE

## 2020-04-26 ENCOUNTER — Encounter: Admit: 2020-04-26 | Discharge: 2020-04-26 | Payer: MEDICARE

## 2020-04-26 MED ORDER — HYDROCHLOROTHIAZIDE 12.5 MG PO TAB
12.5 mg | ORAL_TABLET | Freq: Every morning | ORAL | 3 refills | 30.00000 days | Status: AC
Start: 2020-04-26 — End: ?

## 2020-04-26 MED ORDER — ATORVASTATIN 20 MG PO TAB
20 mg | ORAL_TABLET | Freq: Every day | ORAL | 3 refills | Status: AC
Start: 2020-04-26 — End: ?

## 2020-04-26 MED ORDER — METOPROLOL TARTRATE 37.5 MG PO TAB
ORAL_TABLET | Freq: Every day | ORAL | 3 refills | 90.00000 days | Status: AC
Start: 2020-04-26 — End: ?

## 2020-04-28 ENCOUNTER — Encounter: Admit: 2020-04-28 | Discharge: 2020-04-28 | Payer: MEDICARE

## 2020-04-28 MED ORDER — HYDROCHLOROTHIAZIDE 12.5 MG PO TAB
12.5 mg | ORAL_TABLET | Freq: Every morning | ORAL | 3 refills | 30.00000 days | Status: AC
Start: 2020-04-28 — End: ?

## 2020-10-17 ENCOUNTER — Encounter: Admit: 2020-10-17 | Discharge: 2020-10-17 | Payer: MEDICARE

## 2020-10-17 NOTE — Progress Notes
Please send the following records for continuity of care:    Most recent lipid and liver profile, chemistry panel, and thyroid panel.      Please fax to:       [place]    [facility]- [facility]iology    FAX: [phone]      PHONE: [phone]

## 2020-10-20 ENCOUNTER — Encounter: Admit: 2020-10-20 | Discharge: 2020-10-20 | Payer: MEDICARE

## 2020-10-30 ENCOUNTER — Encounter: Admit: 2020-10-30 | Discharge: 2020-10-30 | Payer: MEDICARE

## 2021-03-20 DEATH — deceased

## 2022-11-18 IMAGING — MR MRI NECK SOFT [PERSON_NAME]/[PERSON_NAME] CONTRAST
8 series · 44 of 48 positions shown · IV contrast (prohance)
Comparison: CT soft tissue neck 06/12/20, PET/CT 08/07/20

INDICATION: 88 years-old Male with parotid gland cancer.
TECHNIQUE: Multiplanar, multisequence MRI of the neck was performed without and with intravenous contrast. The patient received an intravenous dose of 15 mL ProHance.

[Series 2: t2_tse_sag · sagittal · 6.0mm · 0.75mm/px · 3 of 25 slices shown]
[im 1/25]
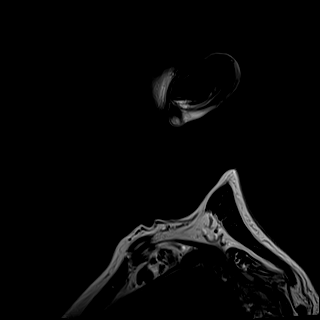
[im 13/25]
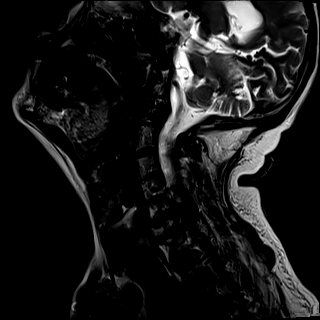
[im 25/25]
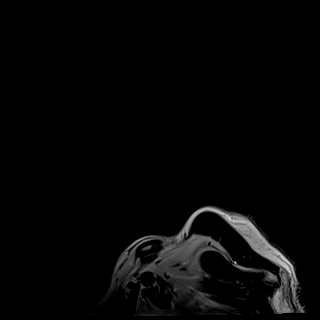

[Series 3: t1_tse_cor_p2 · coronal · 4.5mm · 0.75mm/px · 4 of 30 slices shown]
[im 1/30]
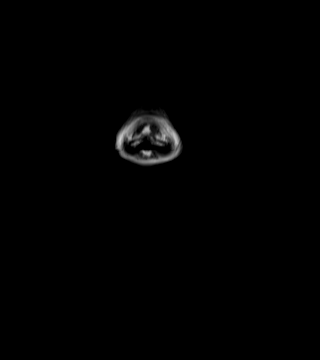
[im 10/30]
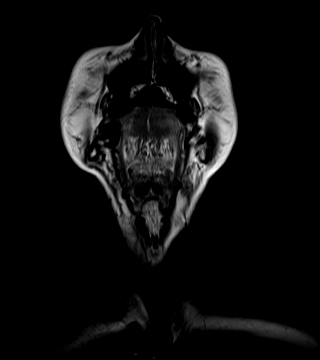
[im 20/30]
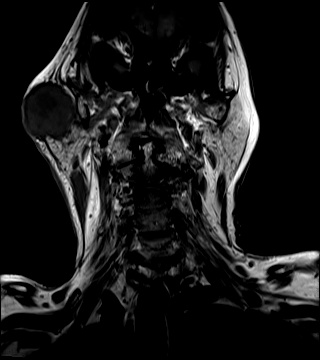
[im 30/30]
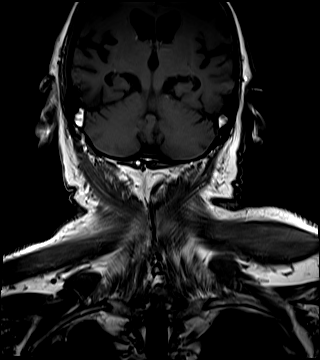

[Series 7: t2_(person_name)_(person_name)_(person_name)_(person_name) · coronal · 4.5mm · 0.75mm/px · 4 of 30 slices shown]
[im 1/30]
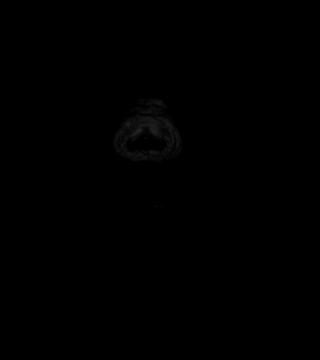
[im 10/30]
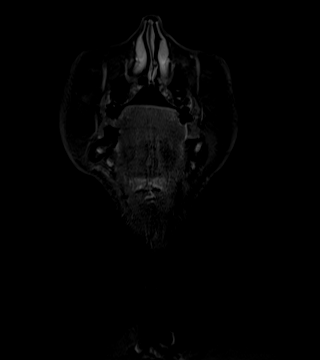
[im 20/30]
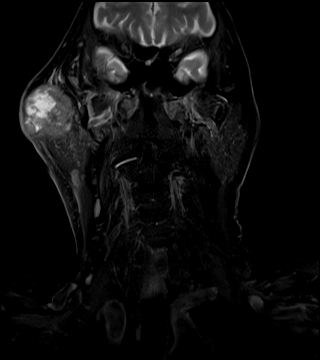
[im 30/30]
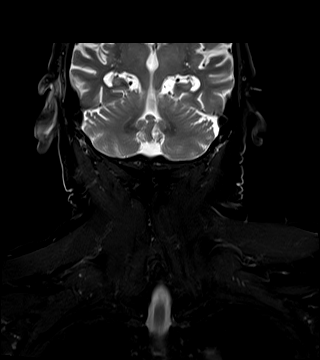

[Series 9: t1_(person_name)_(person_name)_(person_name)_in · axial · 4.0mm · 0.75mm/px · z∈[-208,+7]mm · 8 of 50 slices shown]
[im 1/50]
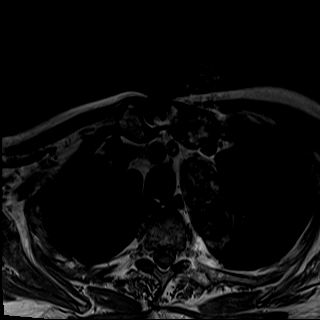
[im 8/50]
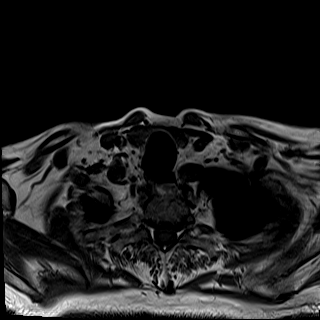
[im 15/50]
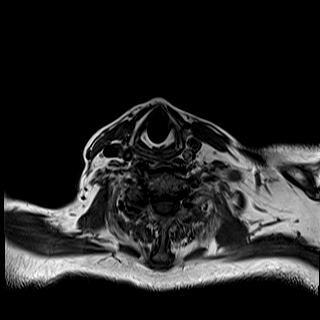
[im 22/50]
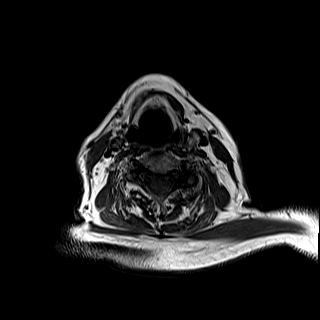
[im 29/50]
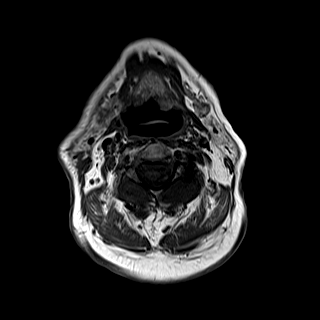
[im 36/50]
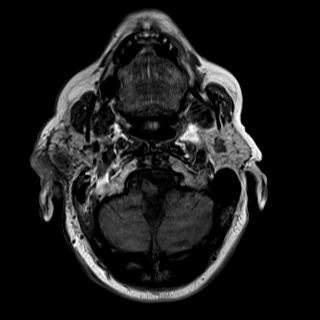
[im 43/50]
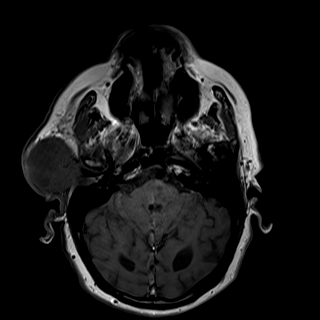
[im 50/50]
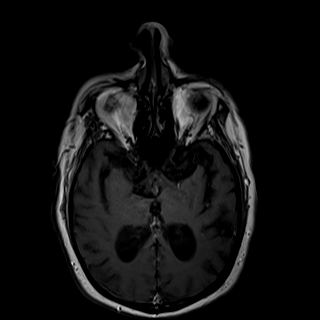

[Series 11: t1_(person_name)_(person_name)_(person_name)_(person_name) · axial · 4.0mm · 0.75mm/px · z∈[-208,+7]mm · 8 of 50 slices shown]
[im 1/50]
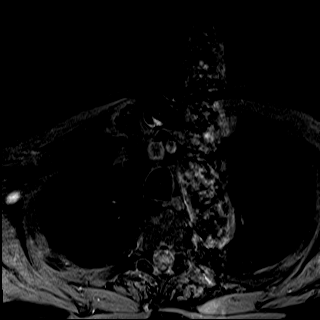
[im 8/50]
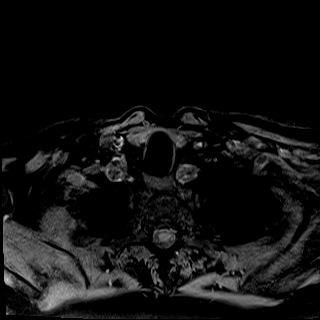
[im 15/50]
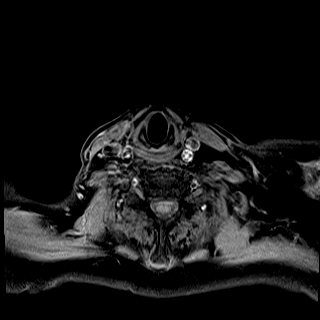
[im 22/50]
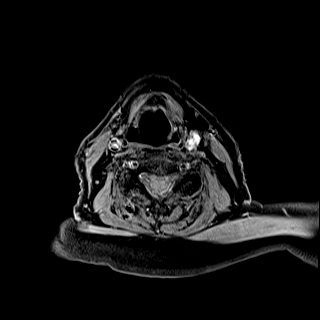
[im 29/50]
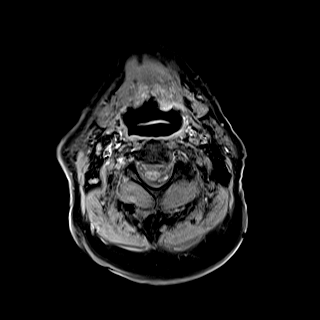
[im 36/50]
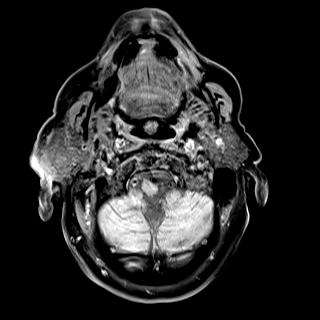
[im 43/50]
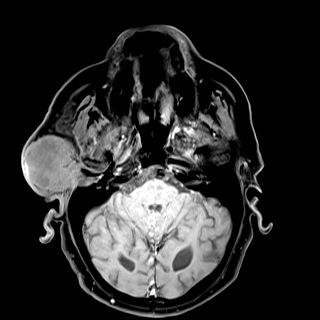
[im 50/50]
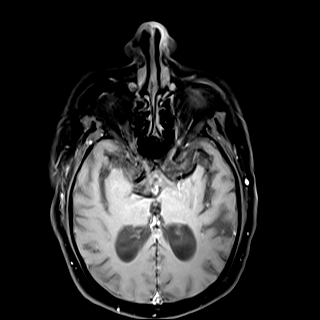

[Series 15: t2_(person_name)_(person_name)_(person_name)_(person_name)2_320_w · axial · 4.0mm · 0.75mm/px · z∈[-208,+7]mm · 8 of 50 slices shown]
[im 1/50]
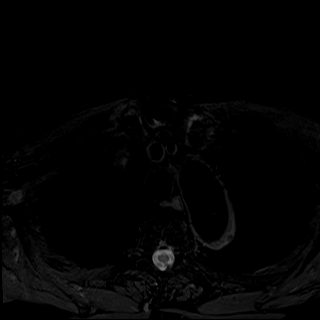
[im 8/50]
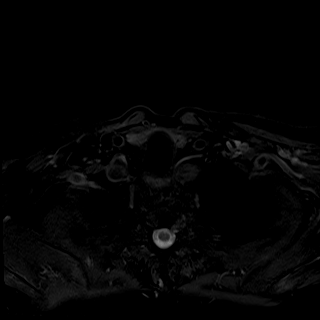
[im 15/50]
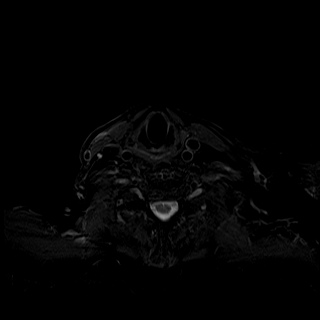
[im 22/50]
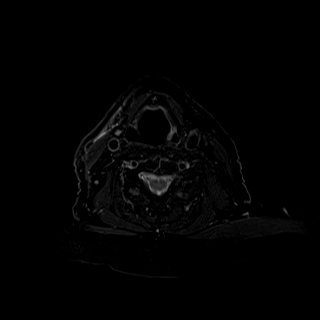
[im 29/50]
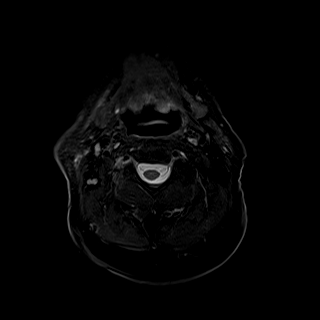
[im 36/50]
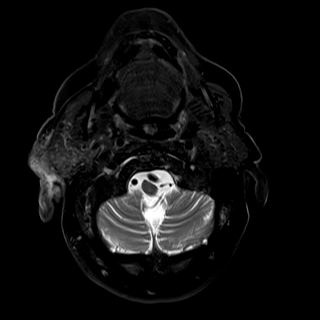
[im 43/50]
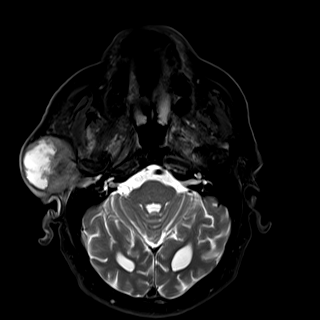
[im 50/50]
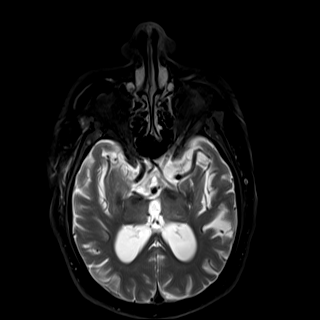

[Series 19: t1_(person_name)_(person_name)_(person_name)+(person_name)_(person_name) · axial · 4.0mm · 0.75mm/px · z∈[-208,+7]mm · 8 of 50 slices shown (1 of 2)]
[im 1/50]
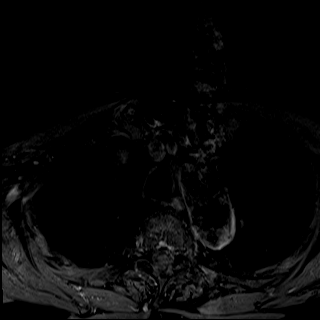
[im 8/50]
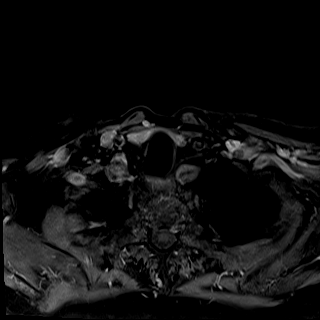
[im 15/50]
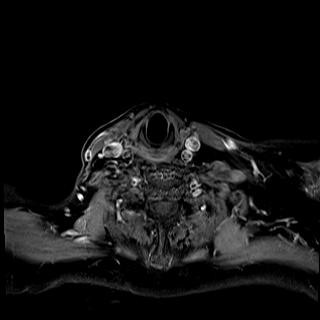
[im 22/50]
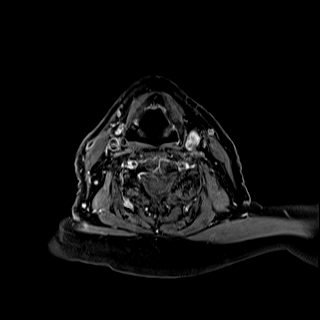
[im 29/50]
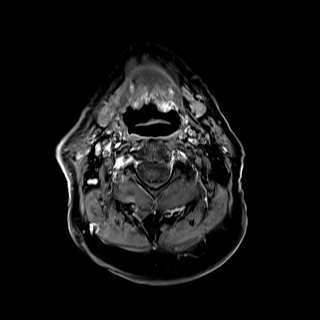
[im 36/50]
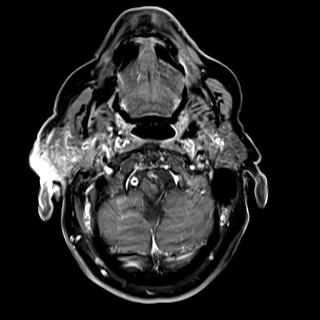
[im 43/50]
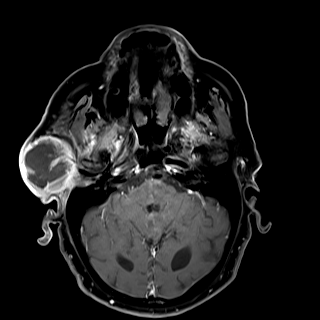
[im 50/50]
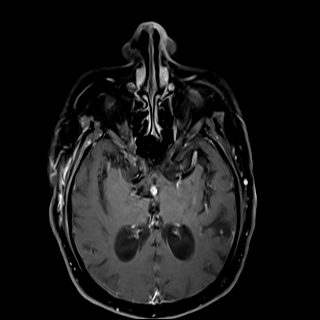

[Series 21: t1_(person_name)_(person_name)_(person_name)+(person_name)_(person_name) · coronal · 4.5mm · 0.75mm/px · 1 of 30 slices shown (2 of 2)]
[im 1/30]
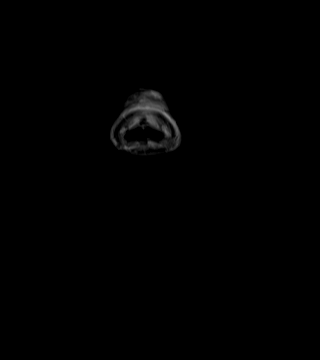

[44 of 48 positions shown; findings below may reference images not displayed]

FINDINGS: Brain: Generalized parenchymal atrophy.

Paranasal sinuses/nasal cavity/mastoid air cells: No significant mucosal thickening or fluid. 

Mucosal space: No mass involving the nasopharynx down to the trachea. No mass involving the visualized esophagus.  

Masticator space: No asymmetric muscle atrophy or enlargement.

Parapharyngeal/retropharyngeal space: Clear.

Salivary glands: Redemonstrated large peripherally enhancing right parotid gland mass measuring 4.7 x 4.7 x 4.1 cm (TV X AP X CC). Soft tissue extension into the right external auditory canal and anterior-inferior aspect of the right external ear. Preserved fat pad at the right stylomastoid foramen.

Curvilinear enhancement extending posterior to the mandibular ramus into the masticator space suspicious for perineural spread along the auriculotemporal nerve ([DATE]). No convincing enhancement along the right V3 or VII nerves. 

Thyroid: No mass.

Lymph nodes: No significant adenopathy by size or enhancement criteria. 

Upper thorax: Lungs clear.

Osseous structures: No acute fracture or destructive lesion. Up to moderate canal stenosis from C4 to C7 secondary to disc osteophyte complexes.

Soft tissues: No mass or inflammation.
IMPRESSION: Partly necrotic right parotid gland malignancy with extension into the right external auditory canal and anterior-inferior aspect of the right external ear. Likely perineural spread along the auriculotemporal nerve to the masticator space. No involvement of mandibular division trigeminal or intracranial facial nerves.

## 2023-06-26 ENCOUNTER — Encounter: Admit: 2023-06-26 | Discharge: 2023-06-26 | Payer: MEDICARE
